# Patient Record
Sex: Male | Born: 1985 | Race: Black or African American | Hispanic: No | Marital: Single | Smoking: Current every day smoker
Health system: Southern US, Community
[De-identification: ages and names within clinical notes are randomized; demographics above are authoritative.]

## PROBLEM LIST (undated history)

## (undated) DIAGNOSIS — F4310 Post-traumatic stress disorder, unspecified: Secondary | ICD-10-CM

## (undated) DIAGNOSIS — R569 Unspecified convulsions: Secondary | ICD-10-CM

## (undated) DIAGNOSIS — F209 Schizophrenia, unspecified: Secondary | ICD-10-CM

## (undated) HISTORY — PX: TONSILLECTOMY: SUR1361

---

## 2009-05-29 ENCOUNTER — Emergency Department (HOSPITAL_COMMUNITY): Admission: EM | Admit: 2009-05-29 | Discharge: 2009-05-29 | Payer: Self-pay | Admitting: Emergency Medicine

## 2016-07-03 DIAGNOSIS — M545 Low back pain: Secondary | ICD-10-CM | POA: Diagnosis present

## 2016-07-03 DIAGNOSIS — M5442 Lumbago with sciatica, left side: Secondary | ICD-10-CM | POA: Insufficient documentation

## 2016-07-03 DIAGNOSIS — F17200 Nicotine dependence, unspecified, uncomplicated: Secondary | ICD-10-CM | POA: Insufficient documentation

## 2016-07-04 ENCOUNTER — Encounter (HOSPITAL_COMMUNITY): Payer: Self-pay | Admitting: Emergency Medicine

## 2016-07-04 ENCOUNTER — Emergency Department (HOSPITAL_COMMUNITY)
Admission: EM | Admit: 2016-07-04 | Discharge: 2016-07-04 | Disposition: A | Discharge status: Home / Self Care | Payer: Managed Care, Other (non HMO) | Attending: Emergency Medicine | Admitting: Emergency Medicine

## 2016-07-04 ENCOUNTER — Emergency Department (HOSPITAL_COMMUNITY): Payer: Managed Care, Other (non HMO)

## 2016-07-04 DIAGNOSIS — M5432 Sciatica, left side: Secondary | ICD-10-CM

## 2016-07-04 LAB — URINALYSIS, ROUTINE W REFLEX MICROSCOPIC
GLUCOSE, UA: NEGATIVE mg/dL
HGB URINE DIPSTICK: NEGATIVE
Ketones, ur: 40 mg/dL — AB
Nitrite: NEGATIVE
PH: 6 (ref 5.0–8.0)
Protein, ur: 100 mg/dL — AB
SPECIFIC GRAVITY, URINE: 1.031 — AB (ref 1.005–1.030)

## 2016-07-04 LAB — URINE MICROSCOPIC-ADD ON

## 2016-07-04 MED ORDER — DICLOFENAC SODIUM ER 100 MG PO TB24: 100.0000 mg | ORAL_TABLET | Freq: Every day | ORAL | 0 refills | Status: AC

## 2016-07-04 MED ORDER — DEXAMETHASONE SODIUM PHOSPHATE 10 MG/ML IJ SOLN
10.0000 mg | Freq: Once | INTRAMUSCULAR | Status: AC
  Administered 2016-07-04: 10 mg via INTRAMUSCULAR
  Filled 2016-07-04: qty 1

## 2016-07-04 MED ORDER — KETOROLAC TROMETHAMINE 60 MG/2ML IM SOLN
60.0000 mg | Freq: Once | INTRAMUSCULAR | Status: AC
  Administered 2016-07-04: 60 mg via INTRAMUSCULAR
  Filled 2016-07-04: qty 2

## 2016-07-04 MED ORDER — LIDOCAINE 5 % EX PTCH: 1.0000 | MEDICATED_PATCH | CUTANEOUS | 0 refills | Status: AC

## 2016-07-04 MED ORDER — PREDNISONE 20 MG PO TABS: ORAL_TABLET | ORAL | 0 refills | Status: AC

## 2016-07-04 MED ORDER — LIDOCAINE 5 % EX PTCH
1.0000 | MEDICATED_PATCH | CUTANEOUS | Status: DC
  Administered 2016-07-04: 1 via TRANSDERMAL
  Filled 2016-07-04: qty 1

## 2016-07-04 MED ORDER — METHOCARBAMOL 1000 MG/10ML IJ SOLN
500.0000 mg | Freq: Once | INTRAMUSCULAR | Status: AC
  Administered 2016-07-04: 500 mg via INTRAMUSCULAR
  Filled 2016-07-04: qty 5

## 2016-07-04 MED ORDER — METHOCARBAMOL 500 MG PO TABS: 500.0000 mg | ORAL_TABLET | Freq: Two times a day (BID) | ORAL | 0 refills | Status: AC

## 2016-07-04 NOTE — ED Notes (Signed)
Bed: WA03 Expected date:  Expected time:  Means of arrival:  Comments: 

## 2016-07-04 NOTE — ED Triage Notes (Signed)
Pt c/o low back pain with numbness and weakness to extremities. Pt seen by provided given Motrin 800mg , no relief. Pt states while working he was moving piano, piano fell causing back injury, pt state she fell last night causing further injury and pain. Pt pale and diaphoretic in triage. Pt denies urinary and bowel incontinence .

## 2016-07-04 NOTE — ED Notes (Addendum)
Pt reports 10/10 pain in bilateral LE. Pain starts in lower back and radiates down legs. Stated PCP diagnosed left sciatica pain on 9/4. After falling last night pt stated "the sciatica pain is now on the left and right".

## 2016-07-04 NOTE — ED Provider Notes (Signed)
[facility]-EMERGENCY DEPT Provider Note   CSN: [id number] Arrival date & time: 07/03/16  2310  By signing my name below, I, Linna DarnerRussell Turner, attest that this documentation has been prepared under the direction and in the presence of physician practitioner, Garry Nicolini, MD. Electronically Signed: Linna Darnerussell Turner, Scribe. 07/04/2016. 2:31 AM.  History   Chief Complaint Chief Complaint  Patient presents with  . Back Pain    The history is provided by the patient. No language interpreter was used.  Back Pain   This is a new problem. The current episode started 3 to 5 hours ago. The problem occurs constantly. The problem has not changed since onset.The pain is associated with falling. The pain is present in the lumbar spine. The pain does not radiate. The pain is severe. The symptoms are aggravated by certain positions. The pain is the same all the time. Associated symptoms include paresthesias. Pertinent negatives include no chest pain, no fever, no numbness, no weight loss, no headaches, no abdominal pain, no abdominal swelling, no bowel incontinence, no perianal numbness, no bladder incontinence, no dysuria, no pelvic pain, no leg pain, no paresis, no tingling and no weakness. He has tried NSAIDs for the symptoms. The treatment provided no relief.     HPI Comments: Cesar Roberts is a 30 y.o. male who presents to the Emergency Department complaining of exacerbation of his back pain occurring after a fall last night. Pt reports he initially injured his back while moving a piano several days ago and had severe left sided lower back pain. He states he fell while going to the bathroom tonight and now his lower back pain is bilateral. Pt also reports paresthesias in his LL extremities. Pt also notes some constipation and states he has not had a BM since 2 days ago. He states he is able to lift his legs but it is painful. Pt reports he is normally ambulatory but states he cannot ambulate currently due to  pain. He states he was prescribed 800 mg ibuprofen and flexeril at Urgent Care yesterday with no relief of his symptoms. He denies neuro deficits, dysuria, difficulty urinating, or any other associated symptoms.  History reviewed. No pertinent past medical history.  There are no active problems to display for this patient.   History reviewed. No pertinent surgical history.     Home Medications    Prior to Admission medications   Not on File    Family History No family history on file.  Social History Social History  Substance Use Topics  . Smoking status: Current Every Day Smoker  . Smokeless tobacco: Never Used  . Alcohol use No     Allergies   Penicillins   Review of Systems Review of Systems  Constitutional: Negative for fever and weight loss.  Cardiovascular: Negative for chest pain.  Gastrointestinal: Negative for abdominal pain and bowel incontinence.  Genitourinary: Negative for bladder incontinence, difficulty urinating, dysuria and pelvic pain.  Musculoskeletal: Positive for back pain (lower).  Neurological: Positive for paresthesias. Negative for tingling, weakness, numbness and headaches.  All other systems reviewed and are negative.   Physical Exam Updated Vital Signs BP 134/83 (BP Location: Left Arm)   Pulse (!) 121   Temp 98 F (36.7 C) (Oral)   Resp 22   SpO2 100%   Physical Exam  Constitutional: He is oriented to person, place, and time. He appears well-developed and well-nourished.  HENT:  Head: Normocephalic.  Mouth/Throat: Oropharynx is clear and moist. No oropharyngeal exudate.  Eyes:  Conjunctivae and EOM are normal. Pupils are equal, round, and reactive to light. Right eye exhibits no discharge. Left eye exhibits no discharge. No scleral icterus.  Neck: Normal range of motion. Neck supple. No JVD present. No tracheal deviation present.  Trachea is midline. No stridor or carotid bruits.  Cardiovascular: Normal rate, regular rhythm,  normal heart sounds and intact distal pulses.   No murmur heard. Pulmonary/Chest: Effort normal and breath sounds normal. No stridor. No respiratory distress. He has no wheezes. He has no rales.  Lungs CTA bilaterally.  Abdominal: Soft. Bowel sounds are normal. He exhibits no distension. There is no tenderness. There is no rebound and no guarding.  Constipated.  Genitourinary:  Genitourinary Comments: Intact rectal tone. Perineal sensation is intact.  Musculoskeletal: Normal range of motion. He exhibits no edema or tenderness.       Right hip: Normal.       Left hip: Normal.       Cervical back: Normal.       Thoracic back: Normal.       Lumbar back: Normal.  No step offs or creptius of c, t, or l spine.  5/5 uppers and lowers  Lymphadenopathy:    He has no cervical adenopathy.  Neurological: He is alert and oriented to person, place, and time. He has normal reflexes.  Skin: Skin is warm and dry. Capillary refill takes less than 2 seconds.  Psychiatric: He has a normal mood and affect. His behavior is normal.  Nursing note and vitals reviewed.   ED Treatments / Results   Radiology No results found.  Procedures Procedures (including critical care time)  DIAGNOSTIC STUDIES: Oxygen Saturation is 100% on RA, normal by my interpretation.    COORDINATION OF CARE: 2:31 AM Discussed treatment plan with pt at bedside and pt agreed to plan.  Medications Ordered in ED Medications - No data to display  Vitals:   07/04/16 0049 07/04/16 0334  BP: 134/83 123/70  Pulse: (!) 121 90  Resp: 22 18  Temp: 98 F (36.7 C)     Initial Impression / Assessment and Plan / ED Course  I have reviewed the triage vital signs and the nursing notes.  Pertinent labs & imaging results that were available during my care of the patient were reviewed by me and considered in my medical decision making (see chart for details).  Clinical Course   Results for orders placed or performed during the  hospital encounter of 07/04/16  Urinalysis, Routine w reflex microscopic (not at [facility])  Result Value Ref Range   Color, Urine ORANGE (A) YELLOW   APPearance CLOUDY (A) CLEAR   Specific Gravity, Urine 1.031 (H) 1.005 - 1.030   pH 6.0 5.0 - 8.0   Glucose, UA NEGATIVE NEGATIVE mg/dL   Hgb urine dipstick NEGATIVE NEGATIVE   Bilirubin Urine MODERATE (A) NEGATIVE   Ketones, ur 40 (A) NEGATIVE mg/dL   Protein, ur 696 (A) NEGATIVE mg/dL   Nitrite NEGATIVE NEGATIVE   Leukocytes, UA TRACE (A) NEGATIVE  Urine microscopic-add on  Result Value Ref Range   Squamous Epithelial / LPF 0-5 (A) NONE SEEN   WBC, UA 0-5 0 - 5 WBC/hpf   RBC / HPF 0-5 0 - 5 RBC/hpf   Bacteria, UA FEW (A) NONE SEEN   Casts HYALINE CASTS (A) NEGATIVE   Urine-Other MUCOUS PRESENT    Dg Lumbar Spine Complete  Result Date: 07/04/2016 CLINICAL DATA:  Status post fall, with worsening lower back pain, radiating down  both legs. Initial encounter. EXAM: LUMBAR SPINE - COMPLETE 4+ VIEW COMPARISON:  None. FINDINGS: There is no evidence of fracture or subluxation. Vertebral bodies demonstrate normal height and alignment. Intervertebral disc spaces are preserved. The visualized neural foramina are grossly unremarkable in appearance. The visualized bowel gas pattern is unremarkable in appearance; air and stool are noted within the colon. The sacroiliac joints are within normal limits. IMPRESSION: No evidence of fracture or subluxation along the lumbar spine. Electronically Signed   By: Roanna Raider M.D.   On: 07/04/2016 03:38   Medications  lidocaine (LIDODERM) 5 % 1 patch (1 patch Transdermal Patch Applied 07/04/16 0506)  ketorolac (TORADOL) injection 60 mg (60 mg Intramuscular Given 07/04/16 0313)  methocarbamol (ROBAXIN) injection 500 mg (500 mg Intramuscular Given 07/04/16 0314)  dexamethasone (DECADRON) injection 10 mg (10 mg Intramuscular Given 07/04/16 0313)     Pain markedly improved post medication.  Moving well.   Final Clinical  Impressions(s) / ED Diagnoses   Final diagnoses:  None   All questions answered to patient's satisfaction. Based on history and exam patient has been appropriately medically screened and emergency conditions excluded. Patient is stable for discharge at this time. Follow up with your PMD for recheck in 2 days and strict return precautions given.  New Prescriptions  Stop ibuprofen, stop flexeril change to voltaren, robaxin and steroid taper and will give RX for lidoderm patches follow up with [facility] Patient verbalizes understanding and agrees to follow up New Prescriptions   No medications on file     Meadow Abramo, MD 07/04/16 [phone]

## 2016-09-11 ENCOUNTER — Other Ambulatory Visit: Payer: Self-pay | Admitting: Orthopedic Surgery

## 2016-09-11 DIAGNOSIS — M545 Low back pain, unspecified: Secondary | ICD-10-CM

## 2016-09-12 ENCOUNTER — Other Ambulatory Visit: Payer: Self-pay | Admitting: Orthopedic Surgery

## 2016-09-12 ENCOUNTER — Ambulatory Visit
Admission: RE | Admit: 2016-09-12 | Discharge: 2016-09-12 | Disposition: A | Discharge status: Home / Self Care | Payer: Self-pay | Source: Ambulatory Visit | Attending: Orthopedic Surgery | Admitting: Orthopedic Surgery

## 2016-09-12 DIAGNOSIS — M545 Low back pain, unspecified: Secondary | ICD-10-CM

## 2016-09-17 ENCOUNTER — Other Ambulatory Visit: Payer: Self-pay | Admitting: Radiology

## 2016-09-18 ENCOUNTER — Encounter (HOSPITAL_COMMUNITY): Payer: Self-pay

## 2016-09-18 ENCOUNTER — Ambulatory Visit (HOSPITAL_COMMUNITY)
Admission: RE | Admit: 2016-09-18 | Discharge: 2016-09-18 | Disposition: A | Discharge status: Home / Self Care | Payer: Managed Care, Other (non HMO) | Source: Ambulatory Visit | Attending: Orthopedic Surgery | Admitting: Orthopedic Surgery

## 2016-09-18 DIAGNOSIS — S3992XA Unspecified injury of lower back, initial encounter: Secondary | ICD-10-CM | POA: Insufficient documentation

## 2016-09-18 DIAGNOSIS — M545 Low back pain, unspecified: Secondary | ICD-10-CM

## 2016-09-18 DIAGNOSIS — Y92009 Unspecified place in unspecified non-institutional (private) residence as the place of occurrence of the external cause: Secondary | ICD-10-CM | POA: Diagnosis not present

## 2016-09-18 DIAGNOSIS — W19XXXA Unspecified fall, initial encounter: Secondary | ICD-10-CM | POA: Insufficient documentation

## 2016-09-18 HISTORY — DX: Schizophrenia, unspecified: F20.9

## 2016-09-18 HISTORY — DX: Post-traumatic stress disorder, unspecified: F43.10

## 2016-09-18 HISTORY — DX: Unspecified convulsions: R56.9

## 2016-09-18 LAB — CBC
HEMATOCRIT: 41.7 % (ref 39.0–52.0)
Hemoglobin: 13 g/dL (ref 13.0–17.0)
MCH: 24.6 pg — ABNORMAL LOW (ref 26.0–34.0)
MCHC: 31.2 g/dL (ref 30.0–36.0)
MCV: 79 fL (ref 78.0–100.0)
Platelets: 216 10*3/uL (ref 150–400)
RBC: 5.28 MIL/uL (ref 4.22–5.81)
RDW: 14.8 % (ref 11.5–15.5)
WBC: 7.1 10*3/uL (ref 4.0–10.5)

## 2016-09-18 LAB — PROTIME-INR
INR: 1.02
Prothrombin Time: 13.4 seconds (ref 11.4–15.2)

## 2016-09-18 LAB — APTT: aPTT: 29 seconds (ref 24–36)

## 2016-09-18 MED ORDER — SODIUM CHLORIDE 0.9 % IV SOLN: INTRAVENOUS | Status: DC

## 2016-09-18 MED ORDER — MIDAZOLAM HCL 2 MG/2ML IJ SOLN
INTRAMUSCULAR | Status: AC
  Filled 2016-09-18: qty 4

## 2016-09-18 MED ORDER — MIDAZOLAM HCL 2 MG/2ML IJ SOLN
INTRAMUSCULAR | Status: AC | PRN
  Administered 2016-09-18: 0.5 mg via INTRAVENOUS
  Administered 2016-09-18 (×2): 1 mg via INTRAVENOUS

## 2016-09-18 MED ORDER — SODIUM CHLORIDE 0.9 % IV SOLN
INTRAVENOUS | Status: AC | PRN
  Administered 2016-09-18: 10 mL/h via INTRAVENOUS

## 2016-09-18 MED ORDER — LIDOCAINE HCL 1 % IJ SOLN
INTRAMUSCULAR | Status: AC
  Filled 2016-09-18: qty 20

## 2016-09-18 MED ORDER — FENTANYL CITRATE (PF) 100 MCG/2ML IJ SOLN
INTRAMUSCULAR | Status: AC | PRN
  Administered 2016-09-18 (×3): 50 ug via INTRAVENOUS

## 2016-09-18 MED ORDER — FENTANYL CITRATE (PF) 100 MCG/2ML IJ SOLN
INTRAMUSCULAR | Status: AC
  Filled 2016-09-18: qty 4

## 2016-09-18 NOTE — Discharge Instructions (Signed)
Needle Biopsy, Care After °Introduction °These instructions give you information about caring for yourself after your procedure. Your doctor may also give you more specific instructions. Call your doctor if you have any problems or questions after your procedure. °Follow these instructions at home: °· Rest as told by your doctor. °· Take medicines only as told by your doctor. °· There are many different ways to close and cover the biopsy site, including stitches (sutures), skin glue, and adhesive strips. Follow instructions from your doctor about: °¨ How to take care of your biopsy site. °¨ When and how you should change your bandage (dressing). °¨ When you should remove your dressing. °¨ Removing whatever was used to close your biopsy site. °· Check your biopsy site every day for signs of infection. Watch for: °¨ Redness, swelling, or pain. °¨ Fluid, blood, or pus. °Contact a doctor if: °· You have a fever. °· You have redness, swelling, or pain at the biopsy site, and it lasts longer than a few days. °· You have fluid, blood, or pus coming from the biopsy site. °· You feel sick to your stomach (nauseous). °· You throw up (vomit). °Get help right away if: °· You are short of breath. °· You have trouble breathing. °· Your chest hurts. °· You feel dizzy or you pass out (faint). °· You have bleeding that does not stop with pressure or a bandage. °· You cough up blood. °· Your belly (abdomen) hurts. °This information is not intended to replace advice given to you by your health care provider. Make sure you discuss any questions you have with your health care provider. °Document Released: 09/27/2008 Document Revised: 03/22/2016 Document Reviewed: 10/11/2014 °© 2017 [employer]vier ° °

## 2016-09-18 NOTE — Sedation Documentation (Signed)
Patient is resting comfortably. 

## 2016-09-18 NOTE — Procedures (Signed)
L3 paraspinal muscle biopsy 18 g core times 1 No comp/EBL

## 2016-09-18 NOTE — H&P (Signed)
Chief Complaint: Patient was seen in consultation today for paraspinal muscle biopsy and fluid collection aspiration at the request of Bissell,Jaclyn M  Referring Physician(s): Dorothy SparkBissell,Jaclyn M  Supervising Physician: Jolaine ClickHoss, Arthur  [facility]  History of Present Illness: Cesar AltoRodney Dominic is a 30 y.o. male   Pt heard a pop in back while moving a piano 06/25/2016. Horrible back pain and treated with rest and Advil. Fell at home few days later with exacerbation in back pain Also noted paraesthesia in Both plantar surfaces of feet and middle of B buttock area Has seen MD with treatment with NSAIDs and Prednisone. Back pain and paraesthesia actually some better starting mid October. Reports 40 lb unintentional wt loss since injury . MRI was performed in outside facility I am unable to access report Pt states he has been told MRI shows abnormal musculature and fluid collection of left back.  Request has been made for left paraspinal muscle biopsy and left paraspinal fluid collection aspiration Dr Bonnielee HaffHoss has reviewed imaging and approves procedure Plan for today   Past Medical History:  Diagnosis Date  . PTSD (post-traumatic stress disorder)   . Schizophrenia (HCC)   . Seizures (HCC)     Past Surgical History:  Procedure Laterality Date  . TONSILLECTOMY      Allergies: Penicillins  Medications: Prior to Admission medications   Medication Sig Start Date End Date Taking? Authorizing Provider  Diclofenac Sodium CR (VOLTAREN-XR) 100 MG 24 hr tablet Take 1 tablet (100 mg total) by mouth daily. Patient not taking: Reported on 09/12/2016 07/04/16   April Palumbo, MD  ibuprofen (ADVIL,MOTRIN) 800 MG tablet Take 800 mg by mouth every 8 (eight) hours as needed (for pain.).    Historical Provider, MD  lidocaine (LIDODERM) 5 % Place 1 patch onto the skin daily. Remove & Discard patch within 12 hours or as directed by MD Patient not taking: Reported on 09/12/2016  07/04/16   April Palumbo, MD  methocarbamol (ROBAXIN) 500 MG tablet Take 1 tablet (500 mg total) by mouth 2 (two) times daily. Patient not taking: Reported on 09/12/2016 07/04/16   April Palumbo, MD  predniSONE (DELTASONE) 20 MG tablet 3 tabs po day one, then 2 po daily x 4 days Patient not taking: Reported on 09/12/2016 07/04/16   April Palumbo, MD     History reviewed. No pertinent family history.  Social History   Social History  . Marital status: Single    Spouse name: N/A  . Number of children: N/A  . Years of education: N/A   Social History Main Topics  . Smoking status: Current Every Day Smoker  . Smokeless tobacco: Never Used  . Alcohol use Yes     Comment: occ  . Drug use:      Comment: smokes marijuana several times a week  . Sexual activity: Not Asked   Other Topics Concern  . None   Social History Narrative  . None    Review of Systems: A 12 point ROS discussed and pertinent positives are indicated in the HPI above.  All other systems are negative.  Review of Systems  Constitutional: Positive for activity change and unexpected weight change. Negative for appetite change, fatigue and fever.  Respiratory: Negative for shortness of breath.   Cardiovascular: Negative for chest pain.  Gastrointestinal: Negative for abdominal pain.  Musculoskeletal: Positive for back pain. Negative for neck pain and neck stiffness.  Neurological: Positive for weakness. Negative for dizziness.  Psychiatric/Behavioral: Negative for behavioral  problems and confusion.    Vital Signs: BP (!) 164/95   Pulse 85   Temp 99.3 F (37.4 C) (Oral)   Ht 5' 9.5" (1.765 m)   Wt 215 lb (97.5 kg)   SpO2 100%   BMI 31.29 kg/m   Physical Exam  Constitutional: He is oriented to person, place, and time. He appears well-nourished.  Cardiovascular: Normal rate, regular rhythm and normal heart sounds.   Pulmonary/Chest: Effort normal and breath sounds normal.  Abdominal: Soft. Bowel sounds are  normal. There is no tenderness.  Musculoskeletal: Normal range of motion. He exhibits tenderness.  Low back pain  Neurological: He is alert and oriented to person, place, and time.  Skin: Skin is warm and dry.  Psychiatric: He has a normal mood and affect. His behavior is normal. Judgment and thought content normal.  Nursing note and vitals reviewed.   Mallampati Score:  MD Evaluation Airway: WNL Heart: WNL Abdomen: WNL Chest/ Lungs: WNL ASA  Classification: 2 Mallampati/Airway Score: One  Imaging: No results found.  Labs:  CBC:  Recent Labs  09/18/16 0949  WBC 7.1  HGB 13.0  HCT 41.7  PLT 216    COAGS: No results for input(s): INR, APTT in the last 8760 hours.  BMP: No results for input(s): NA, K, CL, CO2, GLUCOSE, BUN, CALCIUM, CREATININE, GFRNONAA, GFRAA in the last 8760 hours.  Invalid input(s): CMP  LIVER FUNCTION TESTS: No results for input(s): BILITOT, AST, ALT, ALKPHOS, PROT, ALBUMIN in the last 8760 hours.  TUMOR MARKERS: No results for input(s): AFPTM, CEA, CA199, CHROMGRNA in the last 8760 hours.  Assessment and Plan:  Low back pain since injury moving piano 05/2016. Onset B plantar surfaces and buttock paraesthesia Pain and numbness better at this pont. MRI shows abnormal Left paraspinal musclature and paraspinal fluid collection Now scheduled for aspiration of fluid and musculature biopsy Risks and Benefits discussed with the patient including, but not limited to bleeding, infection, damage to adjacent structures or low yield requiring additional tests. All of the patient's questions were answered, patient is agreeable to proceed. Consent signed and in chart.   Thank you for this interesting consult.  I greatly enjoyed meeting Cesar Roberts and look forward to participating in their care.  A copy of this report was sent to the requesting provider on this date.  Electronically Signed: Ralene MuskratURPIN,Soua Caltagirone A 09/18/2016, 10:24 AM   I spent a  total of  30 Minutes   in face to face in clinical consultation, greater than 50% of which was counseling/coordinating care for left paraspinal muscle bx and paraspinal fluid collection aspiration

## 2016-09-18 NOTE — Sedation Documentation (Signed)
Patient denies pain and is resting comfortably.  

## 2016-09-18 NOTE — Sedation Documentation (Addendum)
Patient is resting comfortably. Feels much more relaxed. Anxiety is gone

## 2016-09-18 NOTE — Sedation Documentation (Signed)
Patient is resting comfortably.  Denies pain.

## 2016-09-18 NOTE — Sedation Documentation (Signed)
Patient is having some pain. More sedation was given

## 2017-04-12 IMAGING — CR DG LUMBAR SPINE COMPLETE 4+V
5 series · 5 of 5 positions shown · non-contrast
Comparison: None.

CLINICAL DATA: Status post fall, with worsening lower back pain,
radiating down both legs. Initial encounter.

EXAM:
LUMBAR SPINE - COMPLETE 4+ VIEW

[t lumbar spine lat]
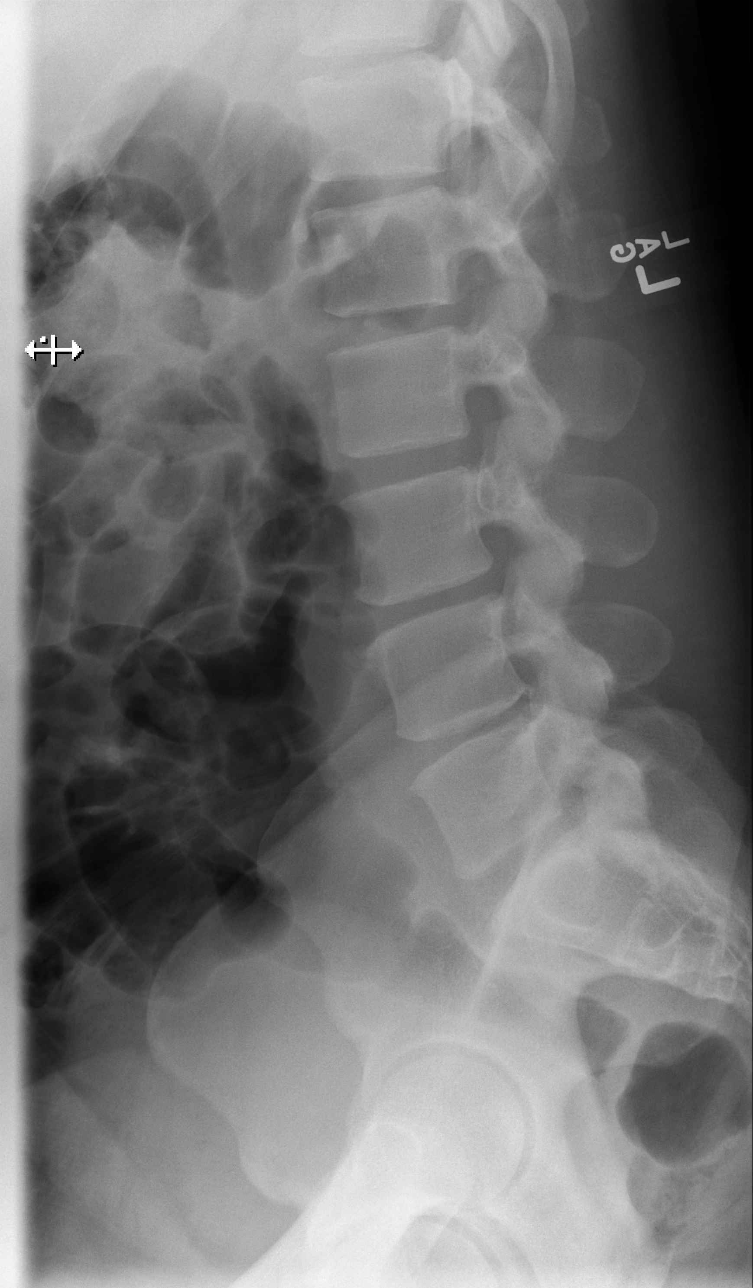

[t lumbar l-5 s-1 spot]
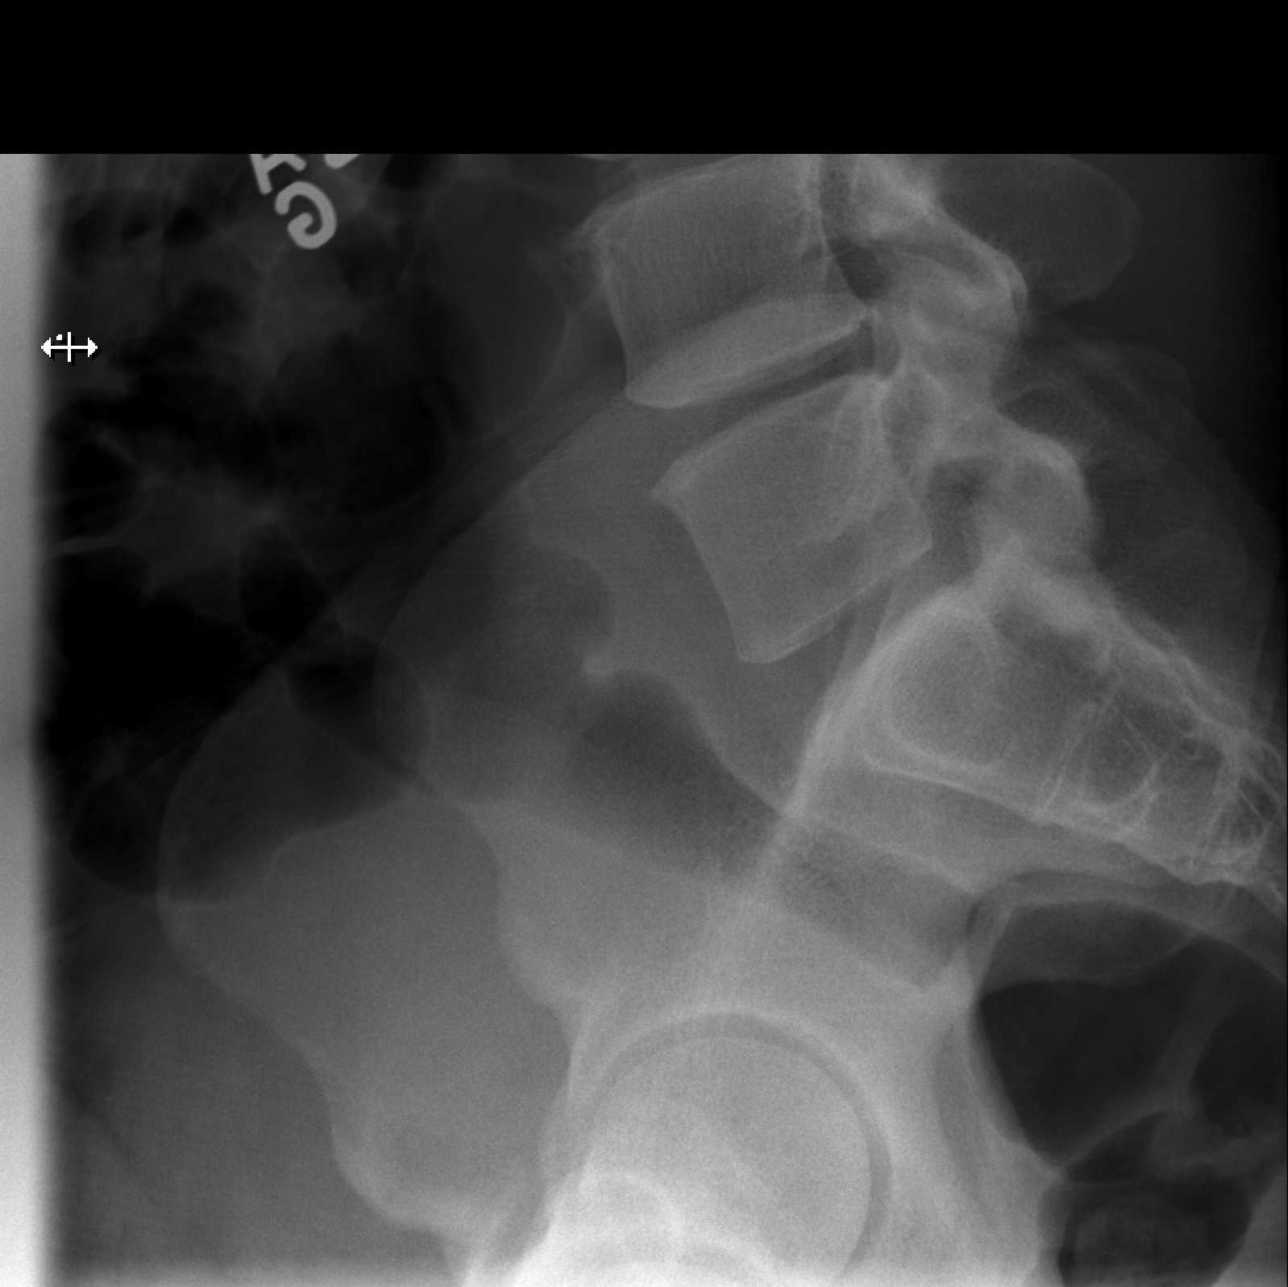

[t lumbar spine obl (1 of 2)]
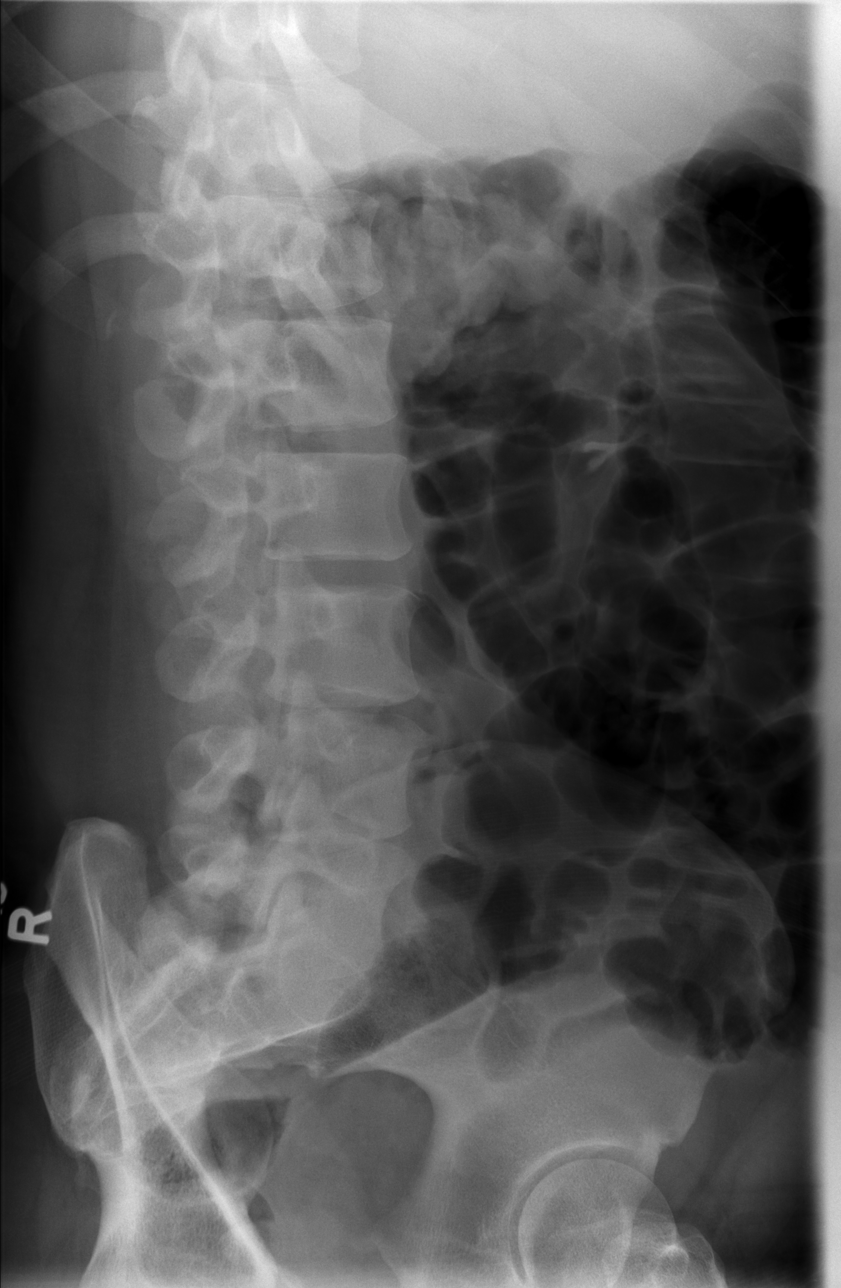

[t lumbar spine ap]
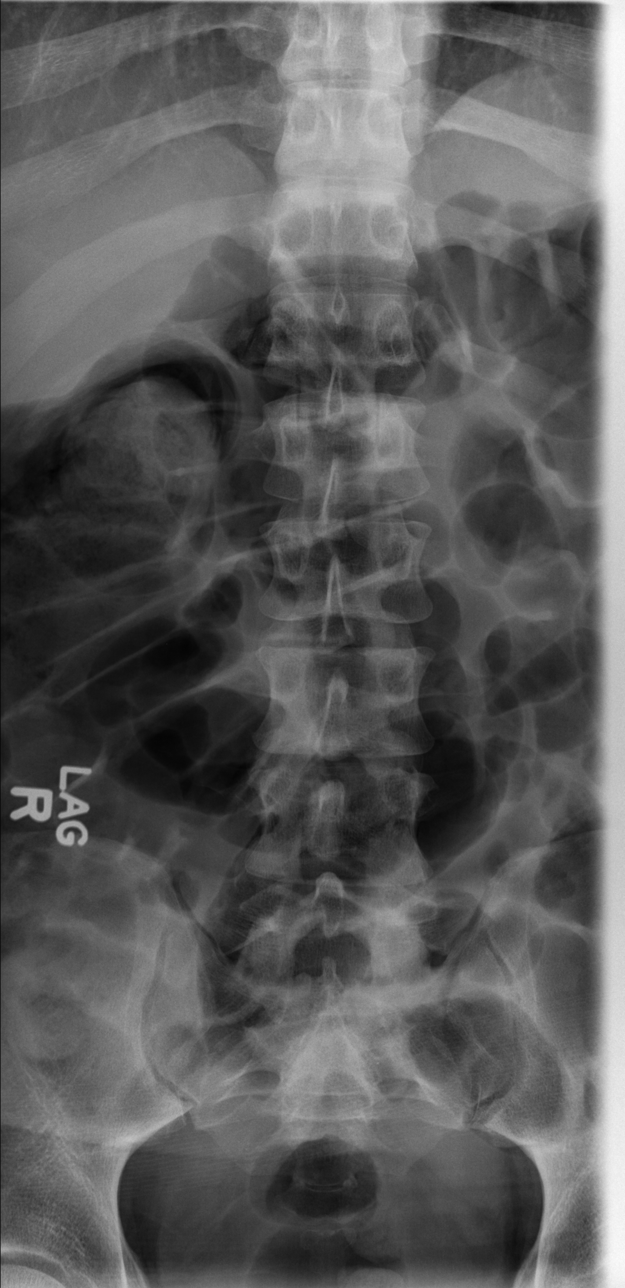

[t lumbar spine obl (2 of 2)]
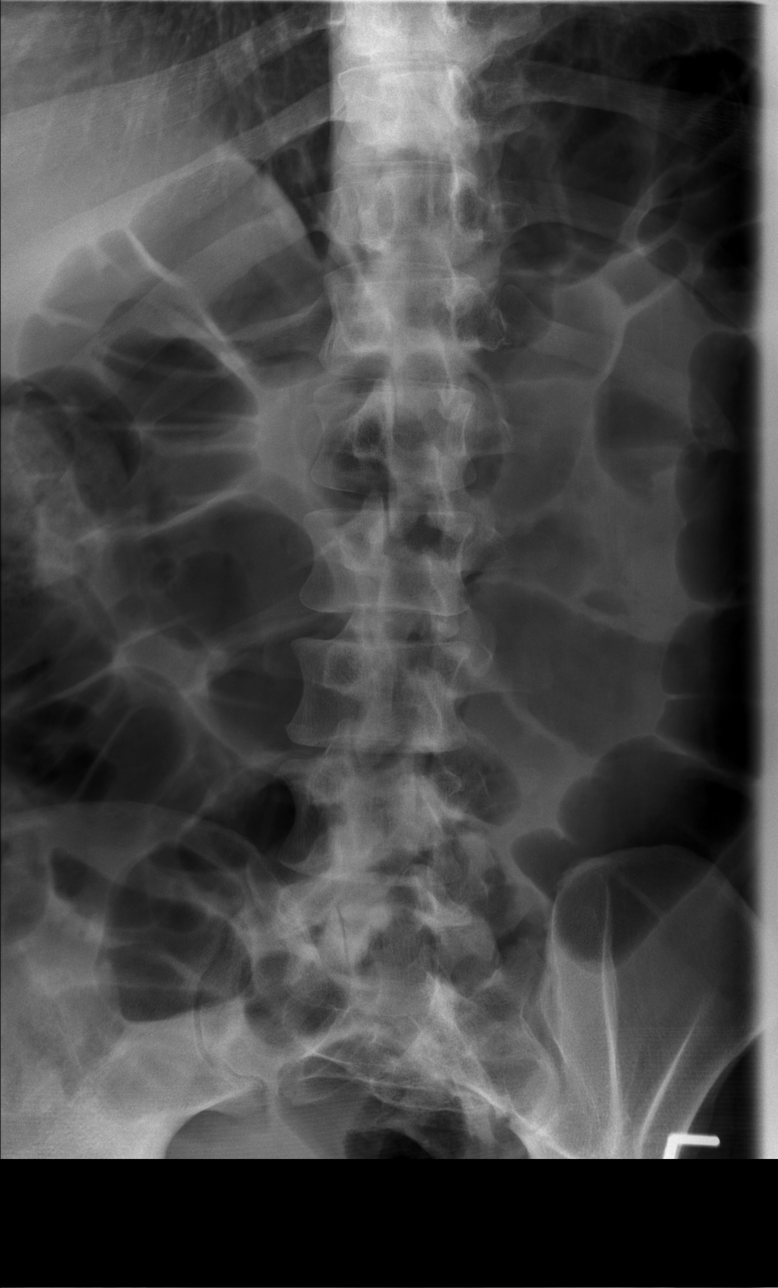

[5 of 5 positions shown; findings below may reference images not displayed]

FINDINGS: There is no evidence of fracture or subluxation. Vertebral bodies
demonstrate normal height and alignment. Intervertebral disc spaces
are preserved. The visualized neural foramina are grossly
unremarkable in appearance.

The visualized bowel gas pattern is unremarkable in appearance; air
and stool are noted within the colon. The sacroiliac joints are
within normal limits.
IMPRESSION: No evidence of fracture or subluxation along the lumbar spine.
